# Patient Record
Sex: Male | Born: 1970 | Race: White | Hispanic: Yes | Marital: Married | Smoking: Never smoker
Health system: Southern US, Community
[De-identification: ages and names within clinical notes are randomized; demographics above are authoritative.]

---

## 2003-10-25 ENCOUNTER — Emergency Department (HOSPITAL_COMMUNITY): Admission: EM | Admit: 2003-10-25 | Discharge: 2003-10-25 | Payer: Self-pay | Admitting: Emergency Medicine

## 2008-03-17 ENCOUNTER — Emergency Department (HOSPITAL_COMMUNITY): Admission: EM | Admit: 2008-03-17 | Discharge: 2008-03-17 | Payer: Self-pay | Admitting: Emergency Medicine

## 2008-06-06 ENCOUNTER — Emergency Department (HOSPITAL_COMMUNITY): Admission: EM | Admit: 2008-06-06 | Discharge: 2008-06-06 | Payer: Self-pay | Admitting: Emergency Medicine
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
## 2011-02-28 LAB — DIFFERENTIAL
Eosinophils Absolute: 1.2 — ABNORMAL HIGH
Lymphocytes Relative: 18
Lymphs Abs: 1.6
Neutro Abs: 5.3

## 2011-02-28 LAB — CBC
Hemoglobin: 15
Platelets: 202
RDW: 13.8

## 2011-02-28 LAB — URINALYSIS, ROUTINE W REFLEX MICROSCOPIC
Bilirubin Urine: NEGATIVE
Glucose, UA: NEGATIVE
Hgb urine dipstick: NEGATIVE
Ketones, ur: NEGATIVE
Protein, ur: NEGATIVE
Urobilinogen, UA: 0.2
pH: 5.5

## 2011-02-28 LAB — COMPREHENSIVE METABOLIC PANEL
Alkaline Phosphatase: 72
Chloride: 108
Creatinine, Ser: 0.86
GFR calc non Af Amer: >60
Glucose, Bld: 117 — ABNORMAL HIGH
Potassium: 3.7
Total Bilirubin: 0.7

## 2011-02-28 LAB — LIPASE, BLOOD: Lipase: 22

## 2018-03-19 ENCOUNTER — Emergency Department (HOSPITAL_COMMUNITY)
Admission: EM | Admit: 2018-03-19 | Discharge: 2018-03-19 | Disposition: A | Discharge status: Home / Self Care | Payer: Self-pay | Attending: Emergency Medicine | Admitting: Emergency Medicine

## 2018-03-19 ENCOUNTER — Emergency Department (HOSPITAL_COMMUNITY): Payer: Self-pay

## 2018-03-19 ENCOUNTER — Encounter (HOSPITAL_COMMUNITY): Payer: Self-pay | Admitting: Emergency Medicine

## 2018-03-19 DIAGNOSIS — K625 Hemorrhage of anus and rectum: Secondary | ICD-10-CM | POA: Insufficient documentation

## 2018-03-19 LAB — URINALYSIS, ROUTINE W REFLEX MICROSCOPIC
BILIRUBIN URINE: NEGATIVE
GLUCOSE, UA: NEGATIVE mg/dL
HGB URINE DIPSTICK: NEGATIVE
KETONES UR: NEGATIVE mg/dL
LEUKOCYTES UA: NEGATIVE
Nitrite: NEGATIVE
PROTEIN: NEGATIVE mg/dL
Specific Gravity, Urine: 1.011 (ref 1.005–1.030)
pH: 5 (ref 5.0–8.0)

## 2018-03-19 LAB — CBC
HCT: 46.6 % (ref 39.0–52.0)
Hemoglobin: 14.7 g/dL (ref 13.0–17.0)
MCH: 28.5 pg (ref 26.0–34.0)
MCHC: 31.5 g/dL (ref 30.0–36.0)
MCV: 90.5 fL (ref 80.0–100.0)
PLATELETS: 242 10*3/uL (ref 150–400)
RBC: 5.15 MIL/uL (ref 4.22–5.81)
RDW: 13.2 % (ref 11.5–15.5)
WBC: 7.1 10*3/uL (ref 4.0–10.5)
nRBC: 0 % (ref 0.0–0.2)

## 2018-03-19 LAB — COMPREHENSIVE METABOLIC PANEL
ALK PHOS: 75 U/L (ref 38–126)
ALT: 37 U/L (ref 0–44)
AST: 30 U/L (ref 15–41)
Albumin: 3.9 g/dL (ref 3.5–5.0)
Anion gap: 7 (ref 5–15)
BUN: 11 mg/dL (ref 6–20)
CALCIUM: 9.1 mg/dL (ref 8.9–10.3)
CO2: 24 mmol/L (ref 22–32)
CREATININE: 0.93 mg/dL (ref 0.61–1.24)
Chloride: 109 mmol/L (ref 98–111)
GFR calc Af Amer: >60 mL/min (ref >60)
Glucose, Bld: 114 mg/dL — ABNORMAL HIGH (ref 70–99)
Potassium: 4.1 mmol/L (ref 3.5–5.1)
SODIUM: 140 mmol/L (ref 135–145)
Total Bilirubin: 0.7 mg/dL (ref 0.3–1.2)
Total Protein: 7.2 g/dL (ref 6.5–8.1)

## 2018-03-19 LAB — TYPE AND SCREEN
ABO/RH(D): A POS
Antibody Screen: NEGATIVE

## 2018-03-19 LAB — LIPASE, BLOOD: LIPASE: 27 U/L (ref 11–51)

## 2018-03-19 LAB — ABO/RH: ABO/RH(D): A POS

## 2018-03-19 MED ORDER — ACETAMINOPHEN 500 MG PO TABS
1000.0000 mg | ORAL_TABLET | Freq: Once | ORAL | Status: AC
  Administered 2018-03-19: 1000 mg via ORAL
  Filled 2018-03-19: qty 2

## 2018-03-19 MED ORDER — MORPHINE SULFATE (PF) 4 MG/ML IV SOLN
4.0000 mg | Freq: Once | INTRAVENOUS | Status: AC
  Administered 2018-03-19: 4 mg via INTRAVENOUS
  Filled 2018-03-19: qty 1

## 2018-03-19 MED ORDER — IOHEXOL 300 MG/ML  SOLN
100.0000 mL | Freq: Once | INTRAMUSCULAR | Status: AC | PRN
  Administered 2018-03-19: 100 mL via INTRAVENOUS

## 2018-03-19 NOTE — ED Triage Notes (Signed)
Pt speaks spanish. Here for eval of blood in stool for 5 days, states it is a lot, and RUQ abdominal pain for 5 days. Pt has diarrhea for 5 days as well. No urinary problems, no emesis. Pt has also had a sore throat for 3 weeks.

## 2018-03-19 NOTE — ED Provider Notes (Signed)
[facility] EMERGENCY DEPARTMENT Provider Note   CSN: [id number] Arrival date & time: 03/19/18  4098     History   Chief Complaint Chief Complaint  Patient presents with  . Abdominal Pain  . Sore Throat  . GI Bleeding   Spanish interpreter utilized  HPI Wesley Cobb is a 47 y.o. male.  HPI Patient is a 48 year old male who presents the emergency department with 4 to 5 days of blood in the stool.  He states this occurs several times a day.  He is never had this happen before.  He also reports looser than normal stools.  He reports small blood clots.  No lightheadedness or weakness.  No syncope.  Reports mild upper abdominal discomfort.  No chest pain or shortness of breath.  No use of anticoagulants.  Mild sore throat for 3 weeks with out difficulty swallowing.  He reports that is improving.   History reviewed. No pertinent past medical history.  There are no active problems to display for this patient.   ** The histories are not reviewed yet. Please review them in the "History" navigator section and refresh this SmartLink.      Home Medications    Prior to Admission medications   Medication Sig Start Date End Date Taking? Authorizing Provider  acetaminophen (TYLENOL) 325 MG tablet Take 325-650 mg by mouth every 6 (six) hours as needed ("for pain or burning in the stomach").   Yes [provider]  ibuprofen (ADVIL,MOTRIN) 200 MG tablet Take 200-400 mg by mouth every 6 (six) hours as needed ("for pain or burning in the stomach").   Yes [provider]    Family History No family history on file.  Social History Social History   Tobacco Use  . Smoking status: Not on file  Substance Use Topics  . Alcohol use: Not on file  . Drug use: Not on file     Allergies   Patient has no known allergies.   Review of Systems Review of Systems  All other systems reviewed and are negative.    Physical Exam Updated Vital  Signs BP 119/84 (BP Location: Right Arm)   Pulse 73   Temp 98.1 F (36.7 C) (Oral)   Resp 16   SpO2 96%   Physical Exam  Constitutional: He is oriented to person, place, and time. He appears well-developed and well-nourished.  HENT:  Head: Normocephalic and atraumatic.  Eyes: EOM are normal.  Neck: Normal range of motion.  Cardiovascular: Normal rate, regular rhythm, normal heart sounds and intact distal pulses.  Pulmonary/Chest: Effort normal and breath sounds normal. No respiratory distress.  Abdominal: Soft. He exhibits no distension. There is no tenderness.  Genitourinary:  Genitourinary Comments: No external hemorrhoids.  No palpable internal hemorrhoids.  No gross blood on rectal examination.  Stool is brown.  Musculoskeletal: Normal range of motion.  Neurological: He is alert and oriented to person, place, and time.  Skin: Skin is warm and dry.  Psychiatric: He has a normal mood and affect. Judgment normal.  Nursing note and vitals reviewed.    ED Treatments / Results  Labs (all labs ordered are listed, but only abnormal results are displayed) Labs Reviewed  COMPREHENSIVE METABOLIC PANEL - Abnormal; Notable for the following components:      Result Value   Glucose, Bld 114 (*)    All other components within normal limits  LIPASE, BLOOD  CBC  URINALYSIS, ROUTINE W REFLEX MICROSCOPIC  POC OCCULT BLOOD, ED  TYPE AND SCREEN  ABO/RH    EKG None  Radiology Dg Chest 2 View  Result Date: 03/19/2018 CLINICAL DATA:  Productive cough. EXAM: CHEST - 2 VIEW COMPARISON:  None. FINDINGS: The heart size and mediastinal contours are within normal limits. Both lungs are clear. The visualized skeletal structures are unremarkable. IMPRESSION: No active cardiopulmonary disease. Electronically Signed   By: Lupita Raider, M.D.   On: 03/19/2018 14:13   Ct Abdomen Pelvis W Contrast  Result Date: 03/19/2018 CLINICAL DATA:  Abdominal pain, generalized right upper quadrant pain  EXAM: CT ABDOMEN AND PELVIS WITH CONTRAST TECHNIQUE: Multidetector CT imaging of the abdomen and pelvis was performed using the standard protocol following bolus administration of intravenous contrast. CONTRAST:  OMNIPAQUE IOHEXOL 300 MG/ML  SOLN COMPARISON:  03/17/2008 FINDINGS: Lower chest: No acute abnormality. Hepatobiliary: No focal liver abnormality is seen. No gallstones, gallbladder wall thickening, or biliary dilatation. Pancreas: Unremarkable. No pancreatic ductal dilatation or surrounding inflammatory changes. Spleen: Normal in size without focal abnormality. Adrenals/Urinary Tract: Adrenal glands are unremarkable. 8 mm hypodense, fluid attenuating right renal mass most consistent with a cyst. Kidneys are otherwise normal, without renal calculi, focal lesion, or hydronephrosis. Bladder is unremarkable. Stomach/Bowel: Stomach is within normal limits. Appendix appears normal. No evidence of bowel wall thickening, distention, or inflammatory changes. Diverticulosis without evidence of diverticulitis. Vascular/Lymphatic: No significant vascular findings are present. No enlarged abdominal or pelvic lymph nodes. Reproductive: Prostatic enlargement. Other: No abdominal wall hernia or abnormality. No abdominopelvic ascites. Musculoskeletal: No acute osseous abnormality. No aggressive osseous lesion. IMPRESSION: 1. No acute abdominal or pelvic pathology. Electronically Signed   By: Elige Ko   On: 03/19/2018 14:27    Procedures Procedures (including critical care time)  Medications Ordered in ED Medications  morphine 4 MG/ML injection 4 mg (4 mg Intravenous Given 03/19/18 1334)  iohexol (OMNIPAQUE) 300 MG/ML solution 100 mL (100 mLs Intravenous Contrast Given 03/19/18 1403)     Initial Impression / Assessment and Plan / ED Course  I have reviewed the triage vital signs and the nursing notes.  Pertinent labs & imaging results that were available during my care of the patient were reviewed  by me and considered in my medical decision making (see chart for details).     Hemoglobin is stable.  Rectal exam demonstrates no gross blood.  Abdominal exam nontender.  CT imaging of the abdomen and pelvis without acute abnormality.  Outpatient GI and primary care follow-up.  Patient encouraged to return to the ER for new or worsening symptoms.  All discharge instructions given with the Spanish interpreter.  All questions answered.  Final Clinical Impressions(s) / ED Diagnoses   Final diagnoses:  Rectal bleeding    ED Discharge Orders    None       Azalia Bilis, MD 03/20/18 2351

## 2018-03-19 NOTE — ED Notes (Signed)
Pt in xray then to CT

## 2019-12-01 ENCOUNTER — Ambulatory Visit: Payer: Self-pay | Attending: Internal Medicine

## 2019-12-01 DIAGNOSIS — Z23 Encounter for immunization: Secondary | ICD-10-CM

## 2019-12-01 NOTE — Progress Notes (Signed)
   Covid-19 Vaccination Clinic  Name:  Wesley Cobb    MRN: [id number] DOB: 11/27/1970  12/01/2019  Wesley Cobb was observed post Covid-19 immunization for 15 minutes without incident. He was provided with Vaccine Information Sheet and instruction to access the V-Safe system.   Wesley Cobb was instructed to call 911 with any severe reactions post vaccine: Marland Kitchen Difficulty breathing  . Swelling of face and throat  . A fast heartbeat  . A bad rash all over body  . Dizziness and weakness   Immunizations Administered    Name Date Dose VIS Date Route   Pfizer COVID-19 Vaccine 12/01/2019  4:05 PM 0.3 mL 07/23/2018 Intramuscular   Manufacturer: [employer], [employer]   Lot: [id number]   NDC: [address]-7

## 2019-12-22 ENCOUNTER — Ambulatory Visit: Payer: Self-pay | Attending: Internal Medicine

## 2019-12-22 DIAGNOSIS — Z23 Encounter for immunization: Secondary | ICD-10-CM

## 2019-12-22 NOTE — Progress Notes (Signed)
   Covid-19 Vaccination Clinic  Name:  Wesley Cobb    MRN: [id number] DOB: 01-02-1971  12/22/2019  Mr. [name] was observed post Covid-19 immunization for 15 minutes without incident. He was provided with Vaccine Information Sheet and instruction to access the V-Safe system.   Mr. [name] was instructed to call 911 with any severe reactions post vaccine: Marland Kitchen Difficulty breathing  . Swelling of face and throat  . A fast heartbeat  . A bad rash all over body  . Dizziness and weakness   Immunizations Administered    Name Date Dose VIS Date Route   Pfizer COVID-19 Vaccine 12/22/2019  4:10 PM 0.3 mL 07/23/2018 Intramuscular   Manufacturer: [employer], [employer]   Lot: [id number]   NDC: [address]-1

## 2019-12-25 ENCOUNTER — Ambulatory Visit: Payer: Self-pay | Attending: Internal Medicine

## 2020-01-27 IMAGING — DX DG CHEST 2V
2 series · 2 of 2 positions shown · non-contrast
Comparison: None.

CLINICAL DATA: Productive cough.

EXAM:
CHEST - 2 VIEW

[chest lat]
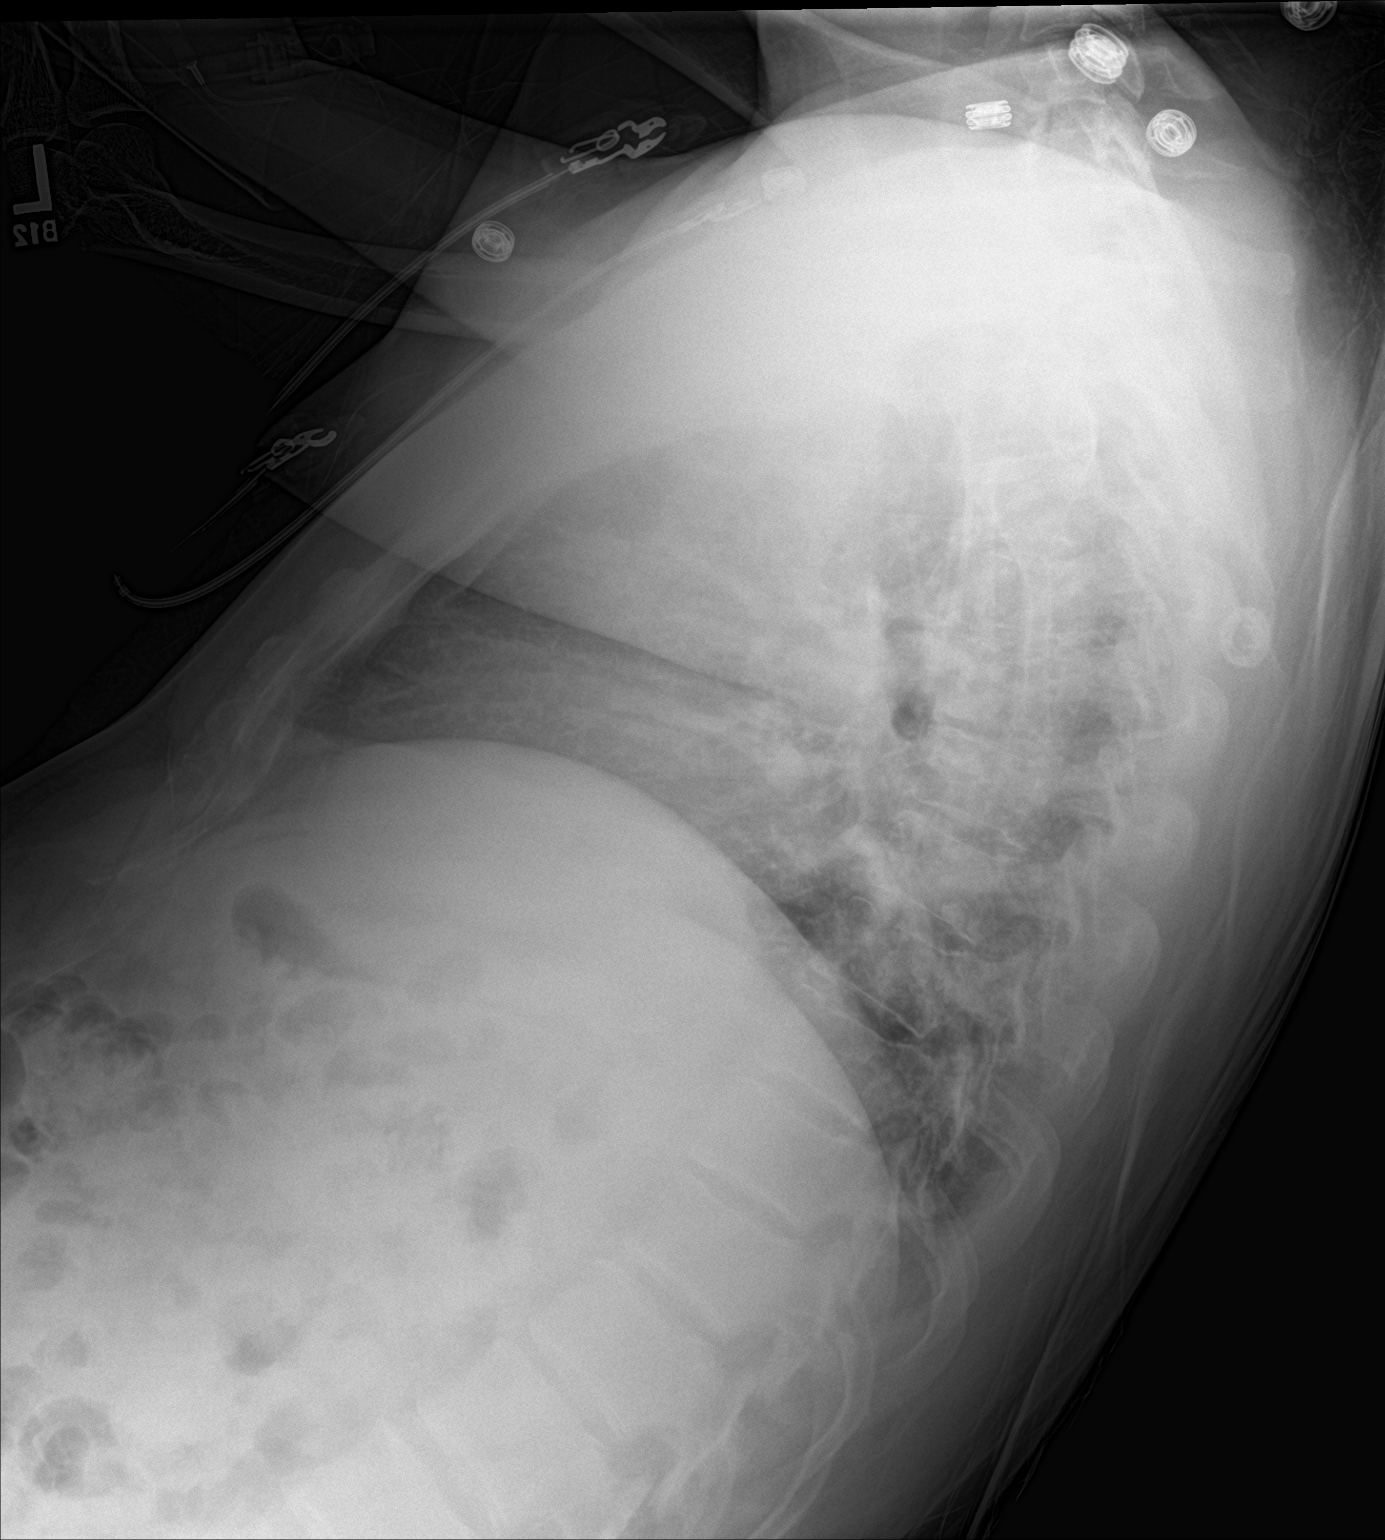

[chest ap]
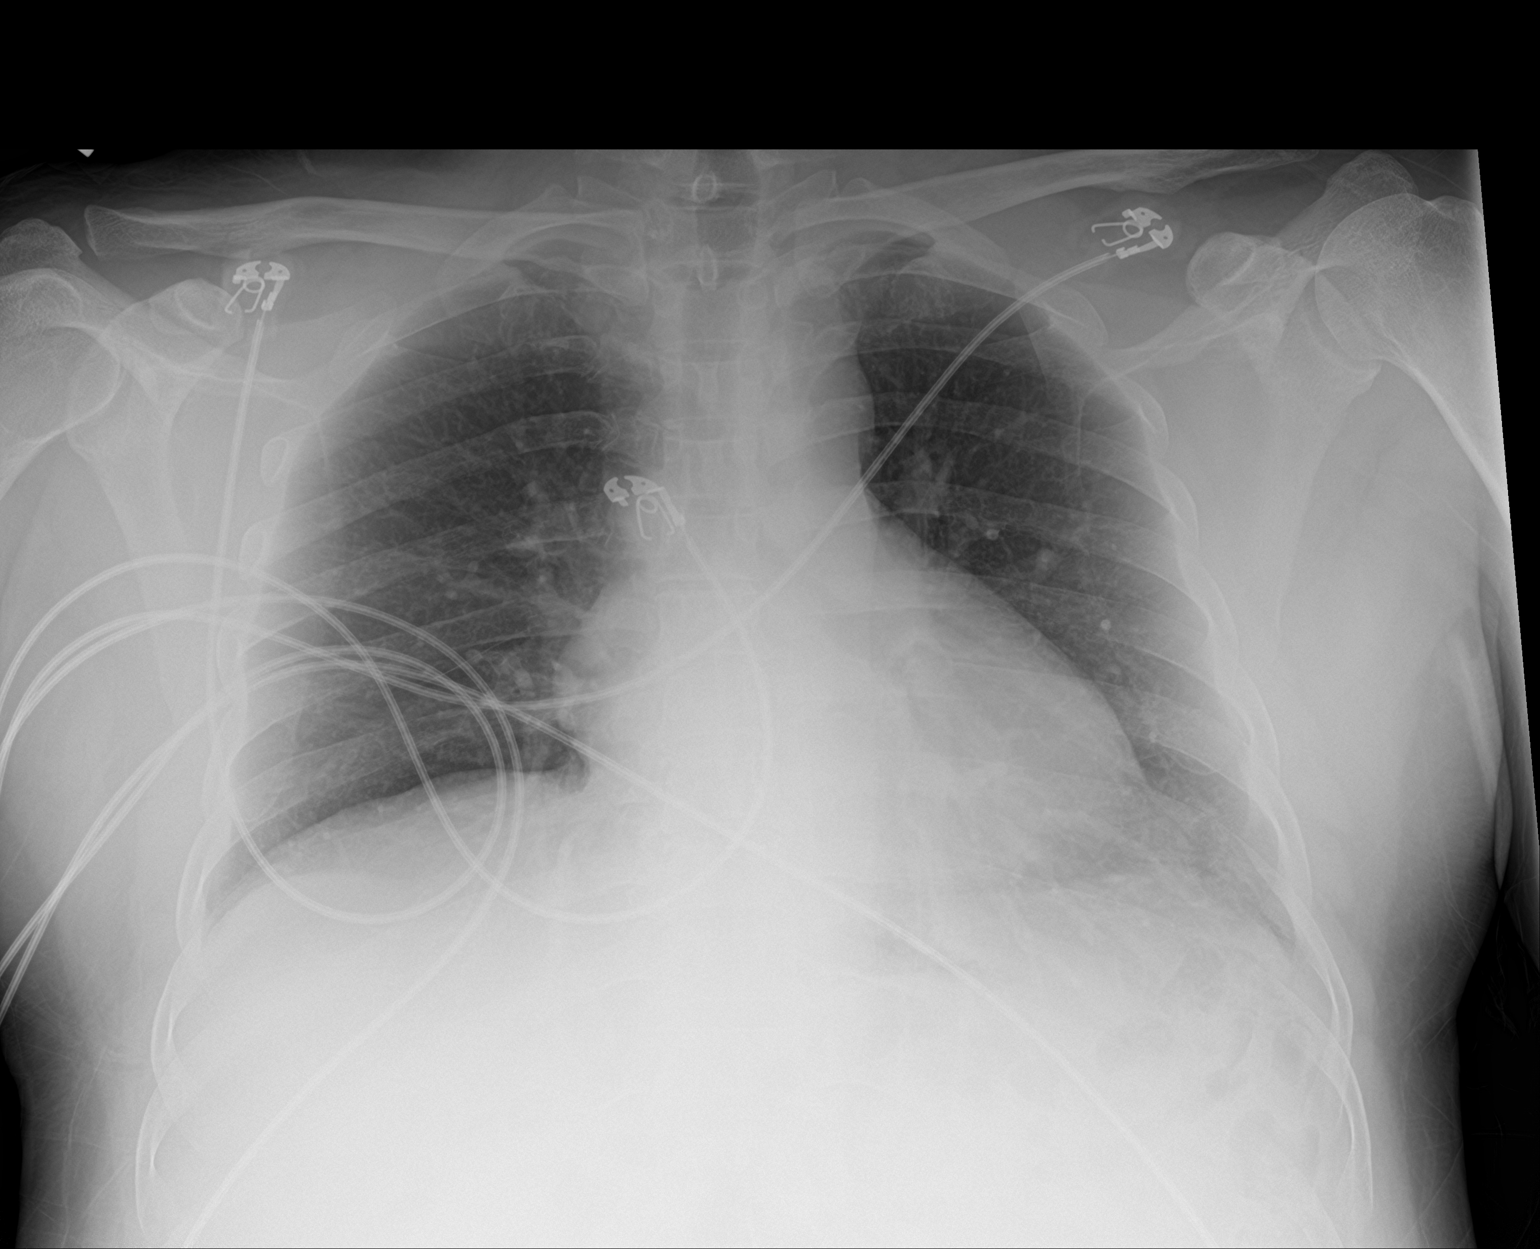

[2 of 2 positions shown; findings below may reference images not displayed]

FINDINGS: The heart size and mediastinal contours are within normal limits.
Both lungs are clear. The visualized skeletal structures are
unremarkable.
IMPRESSION: No active cardiopulmonary disease.

## 2024-05-24 ENCOUNTER — Ambulatory Visit (HOSPITAL_COMMUNITY)
Admission: EM | Admit: 2024-05-24 | Discharge: 2024-05-24 | Disposition: A | Discharge status: Home / Self Care | Payer: Self-pay | Attending: Family Medicine | Admitting: Family Medicine

## 2024-05-24 ENCOUNTER — Encounter (HOSPITAL_COMMUNITY): Payer: Self-pay | Admitting: Emergency Medicine

## 2024-05-24 DIAGNOSIS — M545 Low back pain, unspecified: Secondary | ICD-10-CM

## 2024-05-24 MED ORDER — KETOROLAC TROMETHAMINE 30 MG/ML IJ SOLN
INTRAMUSCULAR | Status: AC
  Filled 2024-05-24: qty 1

## 2024-05-24 MED ORDER — KETOROLAC TROMETHAMINE 30 MG/ML IJ SOLN
30.0000 mg | Freq: Once | INTRAMUSCULAR | Status: AC
  Administered 2024-05-24: 30 mg via INTRAMUSCULAR

## 2024-05-24 MED ORDER — KETOROLAC TROMETHAMINE 10 MG PO TABS: 10.0000 mg | ORAL_TABLET | Freq: Four times a day (QID) | ORAL | 0 refills | Status: AC | PRN

## 2024-05-24 MED ORDER — TIZANIDINE HCL 4 MG PO TABS: 4.0000 mg | ORAL_TABLET | Freq: Three times a day (TID) | ORAL | 0 refills | Status: AC | PRN

## 2024-05-24 NOTE — ED Provider Notes (Signed)
 " [facility]    CSN: [id number] Arrival date & time: 05/24/24  1705      History   Chief Complaint Chief Complaint  Patient presents with   Back Pain    HPI Issai Werling is a 53 y.o. male.    Back Pain  Here for right low back pain.  No trauma or fall.  It bothered him about a month ago and he took a little Tylenol  and it resolved.  Now about 7 days ago it got worse again.  No fever or rash or dysuria.  No dysuria or hematuria  He does note some irritation and tearing from his right eye  It mainly happens at work  [employer]  He has only taken some Tylenol  a few days ago.  History reviewed. No pertinent past medical history.  There are no active problems to display for this patient.   History reviewed. No pertinent surgical history.     Home Medications    Prior to Admission medications  Medication Sig Start Date End Date Taking? Authorizing Provider  ketorolac  (TORADOL ) 10 MG tablet Take 1 tablet (10 mg total) by mouth every 6 (six) hours as needed (pain). 05/24/24  Yes Vonna Sharlet POUR, MD  tiZANidine  (ZANAFLEX ) 4 MG tablet Take 1 tablet (4 mg total) by mouth every 8 (eight) hours as needed for muscle spasms. 05/24/24  Yes Vonna Sharlet POUR, MD  acetaminophen  (TYLENOL ) 325 MG tablet Take 325-650 mg by mouth every 6 (six) hours as needed (for pain or burning in the stomach).    [provider]    Family History History reviewed. No pertinent family history.  Social History Social History[1]   Allergies   Patient has no known allergies.   Review of Systems Review of Systems  Musculoskeletal:  Positive for back pain.     Physical Exam Triage Vital Signs ED Triage Vitals  Encounter Vitals Group     BP 05/24/24 1932 (!) 147/97     Girls Systolic BP Percentile --      Girls Diastolic BP Percentile --      Boys Systolic BP Percentile --      Boys Diastolic BP Percentile --      Pulse Rate 05/24/24 1932 75     Resp  05/24/24 1932 16     Temp 05/24/24 1932 98.3 F (36.8 C)     Temp Source 05/24/24 1932 Oral     SpO2 05/24/24 1932 96 %     Weight --      Height --      Head Circumference --      Peak Flow --      Pain Score 05/24/24 1929 8     Pain Loc --      Pain Education --      Exclude from Growth Chart --    No data found.  Updated Vital Signs BP (!) 147/97 (BP Location: Left Arm)   Pulse 75   Temp 98.3 F (36.8 C) (Oral)   Resp 16   SpO2 96%   Visual Acuity Right Eye Distance:   Left Eye Distance:   Bilateral Distance:    Right Eye Near:   Left Eye Near:    Bilateral Near:     Physical Exam Vitals reviewed.  Constitutional:      General: He is not in acute distress.    Appearance: He is not ill-appearing, toxic-appearing or diaphoretic.  HENT:     Mouth/Throat:  Mouth: Mucous membranes are moist.  Eyes:     Extraocular Movements: Extraocular movements intact.     Pupils: Pupils are equal, round, and reactive to light.     Comments: He has bilateral pterygia.  The lids are not swollen.  Pupils are equally round and reactive to light bilaterally  Cardiovascular:     Rate and Rhythm: Normal rate and regular rhythm.     Heart sounds: No murmur heard. Pulmonary:     Effort: Pulmonary effort is normal.     Breath sounds: Normal breath sounds.  Musculoskeletal:        General: No deformity.     Cervical back: Neck supple.     Right lower leg: No edema.     Left lower leg: No edema.     Comments: Straight leg raise is negative  Lymphadenopathy:     Cervical: No cervical adenopathy.  Skin:    Coloration: Skin is not jaundiced or pale.  Neurological:     General: No focal deficit present.     Mental Status: He is alert and oriented to person, place, and time.  Psychiatric:        Behavior: Behavior normal.      UC Treatments / Results  Labs (all labs ordered are listed, but only abnormal results are displayed) Labs Reviewed - No data to  display  EKG   Radiology No results found.  Procedures Procedures (including critical care time)  Medications Ordered in UC Medications  ketorolac  (TORADOL ) 30 MG/ML injection 30 mg (30 mg Intramuscular Given 05/24/24 2018)    Initial Impression / Assessment and Plan / UC Course  I have reviewed the triage vital signs and the nursing notes.  Pertinent labs & imaging results that were available during my care of the patient were reviewed by me and considered in my medical decision making (see chart for details).     Toradol  is given here and Toradol  tablets are sent to the pharmacy.  Tizanidine  is also sent in for possible muscle spasm.  I think he should see an ophthalmologist for the eye problem  And he is given information on how to set up primary care  Visit is conducted in Spanish Final Clinical Impressions(s) / UC Diagnoses   Final diagnoses:  Acute right-sided low back pain without sciatica     Discharge Instructions      You have been given a shot of Toradol  30 mg today.  Ketorolac  10 mg tablets--take 1 tablet every 6 hours as needed for pain.  This is the same medicine that is in the shot we just gave you  Take tizanidine  4 mg--1 every 8 hours as needed for muscle spasms; this medication can cause dizziness and sleepiness  I think your eye problem is something that you need to see an eye doctor for.  Please call the eye doctor listed in this paperwork  You can go to the website [facility] [email or web address] and make a primary care appointment   [facility] le han administrado una inyeccin de Toradol  30 mg.  Comprimidos de ketorolaco 10 mg: tome un comprimido cada 6 horas segn sea necesario para el dolor. Este es el mismo medicamento que contiene la inyeccin que le acabamos de building services engineer.  Tome tizanidina 4 mg: un comprimido cada 8 horas segn sea necesario para los espasmos musculares; este medicamento puede causar mareos y somnolencia. Creo que tu problema  de la vista requiere que consultes con counselling psychologist. Por favor, llama al oftalmlogo que  aparece en esta documentacin.  Tambin puedes visitar el sitio web [email or web address] y programar una cita con un mdico de atencin primaria.)    ED Prescriptions     Medication Sig Dispense Auth. Provider   ketorolac  (TORADOL ) 10 MG tablet Take 1 tablet (10 mg total) by mouth every 6 (six) hours as needed (pain). 20 tablet Velma Hanna, Sharlet POUR, MD   tiZANidine  (ZANAFLEX ) 4 MG tablet Take 1 tablet (4 mg total) by mouth every 8 (eight) hours as needed for muscle spasms. 15 tablet Shanise Balch K, MD      PDMP not reviewed this encounter.    [1]  Social History Tobacco Use   Smoking status: Never   Smokeless tobacco: Never  Vaping Use   Vaping status: Never Used  Substance Use Topics   Alcohol use: Yes    Alcohol/week: 2.0 standard drinks of alcohol    Types: 2 Cans of beer per week   Drug use: Never     Vonna Sharlet POUR, MD 05/24/24 2114  "

## 2024-05-24 NOTE — ED Triage Notes (Addendum)
 Patient has right side lower back pain  near his hip that has been going on for 4 weeks.  Patient has taken tylenol   4 days ago

## 2024-05-24 NOTE — Discharge Instructions (Addendum)
 You have been given a shot of Toradol  30 mg today.  Ketorolac  10 mg tablets--take 1 tablet every 6 hours as needed for pain.  This is the same medicine that is in the shot we just gave you  Take tizanidine  4 mg--1 every 8 hours as needed for muscle spasms; this medication can cause dizziness and sleepiness  I think your eye problem is something that you need to see an eye doctor for.  Please call the eye doctor listed in this paperwork  You can go to the website [facility] [email or web address] and make a primary care appointment   [facility] le han administrado una inyeccin de Toradol  30 mg.  Comprimidos de ketorolaco 10 mg: tome un comprimido cada 6 horas segn sea necesario para el dolor. Este es el mismo medicamento que contiene la inyeccin que le acabamos de building services engineer.  Tome tizanidina 4 mg: un comprimido cada 8 horas segn sea necesario para los espasmos musculares; este medicamento puede causar mareos y somnolencia. Creo que tu problema de la vista requiere que consultes con counselling psychologist. Por favor, llama al oftalmlogo que aparece en esta documentacin.  Tambin puedes visitar el sitio web [email or web address] y programar una cita con un mdico de atencin primaria.)
